# Patient Record
Sex: Female | Born: 2005 | Race: White | Hispanic: Yes | Marital: Single | Smoking: Never smoker
Health system: Southern US, Community
[De-identification: ages and names within clinical notes are randomized; demographics above are authoritative.]

---
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
## 2025-04-24 ENCOUNTER — Ambulatory Visit: Admitting: Neurology
